# Patient Record
Sex: Female | Born: 1955 | Race: White | Hispanic: No | Smoking: Never smoker
Health system: Southern US, Community
[De-identification: ages and names within clinical notes are randomized; demographics above are authoritative.]

## PROBLEM LIST (undated history)

## (undated) DIAGNOSIS — I10 Essential (primary) hypertension: Secondary | ICD-10-CM

---

## 2014-01-26 ENCOUNTER — Encounter (HOSPITAL_COMMUNITY): Payer: Self-pay | Admitting: Emergency Medicine

## 2014-01-26 ENCOUNTER — Emergency Department (HOSPITAL_COMMUNITY): Payer: No Typology Code available for payment source

## 2014-01-26 ENCOUNTER — Emergency Department (HOSPITAL_COMMUNITY)
Admission: EM | Admit: 2014-01-26 | Discharge: 2014-01-26 | Disposition: A | Discharge status: Home / Self Care | Payer: No Typology Code available for payment source | Attending: Emergency Medicine | Admitting: Emergency Medicine

## 2014-01-26 DIAGNOSIS — S0081XA Abrasion of other part of head, initial encounter: Secondary | ICD-10-CM

## 2014-01-26 DIAGNOSIS — Z79899 Other long term (current) drug therapy: Secondary | ICD-10-CM | POA: Insufficient documentation

## 2014-01-26 DIAGNOSIS — S51012A Laceration without foreign body of left elbow, initial encounter: Secondary | ICD-10-CM

## 2014-01-26 DIAGNOSIS — IMO0002 Reserved for concepts with insufficient information to code with codable children: Secondary | ICD-10-CM | POA: Insufficient documentation

## 2014-01-26 DIAGNOSIS — Y9389 Activity, other specified: Secondary | ICD-10-CM | POA: Insufficient documentation

## 2014-01-26 DIAGNOSIS — S51009A Unspecified open wound of unspecified elbow, initial encounter: Secondary | ICD-10-CM | POA: Insufficient documentation

## 2014-01-26 DIAGNOSIS — S0990XA Unspecified injury of head, initial encounter: Secondary | ICD-10-CM | POA: Insufficient documentation

## 2014-01-26 DIAGNOSIS — S20219A Contusion of unspecified front wall of thorax, initial encounter: Secondary | ICD-10-CM

## 2014-01-26 DIAGNOSIS — Z23 Encounter for immunization: Secondary | ICD-10-CM | POA: Insufficient documentation

## 2014-01-26 DIAGNOSIS — S0993XA Unspecified injury of face, initial encounter: Secondary | ICD-10-CM | POA: Insufficient documentation

## 2014-01-26 DIAGNOSIS — Y92410 Unspecified street and highway as the place of occurrence of the external cause: Secondary | ICD-10-CM | POA: Insufficient documentation

## 2014-01-26 DIAGNOSIS — S199XXA Unspecified injury of neck, initial encounter: Secondary | ICD-10-CM

## 2014-01-26 DIAGNOSIS — I10 Essential (primary) hypertension: Secondary | ICD-10-CM | POA: Insufficient documentation

## 2014-01-26 HISTORY — DX: Essential (primary) hypertension: I10

## 2014-01-26 LAB — CBC
HCT: 45.3 % (ref 36.0–46.0)
Hemoglobin: 15 g/dL (ref 12.0–15.0)
MCH: 29.9 pg (ref 26.0–34.0)
MCHC: 33.1 g/dL (ref 30.0–36.0)
MCV: 90.4 fL (ref 78.0–100.0)
Platelets: 246 10*3/uL (ref 150–400)
RBC: 5.01 MIL/uL (ref 3.87–5.11)
RDW: 13.8 % (ref 11.5–15.5)
WBC: 19 10*3/uL — ABNORMAL HIGH (ref 4.0–10.5)

## 2014-01-26 LAB — I-STAT TROPONIN, ED: Troponin i, poc: 0 ng/mL (ref 0.00–0.08)

## 2014-01-26 LAB — BASIC METABOLIC PANEL
BUN: 19 mg/dL (ref 6–23)
CALCIUM: 9.6 mg/dL (ref 8.4–10.5)
CHLORIDE: 98 meq/L (ref 96–112)
CO2: 27 meq/L (ref 19–32)
CREATININE: 0.94 mg/dL (ref 0.50–1.10)
GFR calc Af Amer: 77 mL/min — ABNORMAL LOW (ref >90)
GFR calc non Af Amer: 66 mL/min — ABNORMAL LOW (ref >90)
Glucose, Bld: 141 mg/dL — ABNORMAL HIGH (ref 70–99)
Potassium: 3.6 mEq/L — ABNORMAL LOW (ref 3.7–5.3)
Sodium: 140 mEq/L (ref 137–147)

## 2014-01-26 MED ORDER — NITROGLYCERIN 0.4 MG SL SUBL
0.4000 mg | SUBLINGUAL_TABLET | SUBLINGUAL | Status: DC | PRN
  Filled 2014-01-26: qty 1

## 2014-01-26 MED ORDER — TETANUS-DIPHTH-ACELL PERTUSSIS 5-2.5-18.5 LF-MCG/0.5 IM SUSP
0.5000 mL | Freq: Once | INTRAMUSCULAR | Status: AC
  Administered 2014-01-26: 0.5 mL via INTRAMUSCULAR
  Filled 2014-01-26: qty 0.5

## 2014-01-26 MED ORDER — OXYCODONE-ACETAMINOPHEN 5-325 MG PO TABS: 1.0000 | ORAL_TABLET | ORAL | Status: AC | PRN

## 2014-01-26 MED ORDER — IOHEXOL 350 MG/ML SOLN
100.0000 mL | Freq: Once | INTRAVENOUS | Status: AC | PRN
  Administered 2014-01-26: 100 mL via INTRAVENOUS

## 2014-01-26 MED ORDER — ASPIRIN 325 MG PO TABS
325.0000 mg | ORAL_TABLET | ORAL | Status: DC
  Filled 2014-01-26: qty 1

## 2014-01-26 MED ORDER — HYDROMORPHONE HCL PF 1 MG/ML IJ SOLN
1.0000 mg | Freq: Once | INTRAMUSCULAR | Status: AC
Start: 2014-01-26 — End: 2014-01-26
  Administered 2014-01-26: 1 mg via INTRAVENOUS
  Filled 2014-01-26: qty 1

## 2014-01-26 NOTE — Discharge Instructions (Signed)
Abrasion °An abrasion is a cut or scrape of the skin. Abrasions do not extend through all layers of the skin and most heal within 10 days. It is important to care for your abrasion properly to prevent infection. °CAUSES  °Most abrasions are caused by falling on, or gliding across, the ground or other surface. When your skin rubs on something, the outer and inner layer of skin rubs off, causing an abrasion. °DIAGNOSIS  °Your caregiver will be able to diagnose an abrasion during a physical exam.  °TREATMENT  °Your treatment depends on how large and deep the abrasion is. Generally, your abrasion will be cleaned with water and a mild soap to remove any dirt or debris. An antibiotic ointment may be put over the abrasion to prevent an infection. A bandage (dressing) may be wrapped around the abrasion to keep it from getting dirty.  °You may need a tetanus shot if: °· You cannot remember when you had your last tetanus shot. °· You have never had a tetanus shot. °· The injury broke your skin. °If you get a tetanus shot, your arm may swell, get red, and feel warm to the touch. This is common and not a problem. If you need a tetanus shot and you choose not to have one, there is a rare chance of getting tetanus. Sickness from tetanus can be serious.  °HOME CARE INSTRUCTIONS  °· If a dressing was applied, change it at least once a day or as directed by your caregiver. If the bandage sticks, soak it off with warm water.   °· Wash the area with water and a mild soap to remove all the ointment 2 times a day. Rinse off the soap and pat the area dry with a clean towel.   °· Reapply any ointment as directed by your caregiver. This will help prevent infection and keep the bandage from sticking. Use gauze over the wound and under the dressing to help keep the bandage from sticking.   °· Change your dressing right away if it becomes wet or dirty.   °· Only take over-the-counter or prescription medicines for pain, discomfort, or fever as  directed by your caregiver.   °· Follow up with your caregiver within 24 48 hours for a wound check, or as directed. If you were not given a wound-check appointment, look closely at your abrasion for redness, swelling, or pus. These are signs of infection. °SEEK IMMEDIATE MEDICAL CARE IF:  °· You have increasing pain in the wound.   °· You have redness, swelling, or tenderness around the wound.   °· You have pus coming from the wound.   °· You have a fever or persistent symptoms for more than 2 3 days. °· You have a fever and your symptoms suddenly get worse. °· You have a bad smell coming from the wound or dressing.   °MAKE SURE YOU:  °· Understand these instructions. °· Will watch your condition. °· Will get help right away if you are not doing well or get worse. °Document Released: 05/09/2005 Document Revised: 07/16/2012 Document Reviewed: 07/03/2011 °[employer]® Patient Information ©2014 [employer]. ° °
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
## 2014-01-26 NOTE — ED Provider Notes (Signed)
LACERATION REPAIR Performed by: Trixie DredgeWEST, EMILY  Performed by Suzzette Righterourtney Scott, PA-S, under my supervision.  Authorized by: Trixie DredgeWEST, EMILY Consent: Verbal consent obtained. Risks and benefits: risks, benefits and alternatives were discussed Consent given by: patient Patient identity confirmed: provided demographic data Prepped and Draped in normal sterile fashion Wound explored  Laceration Location: left elbow  Laceration Length: 4cm  No Foreign Bodies seen or palpated  Anesthesia: local infiltration  Local anesthetic: lidocaine 2% no epinephrine  Anesthetic total: 1.5 ml  Irrigation method: syringe Amount of cleaning: standard  Skin closure: 4-0 ethilon  Number of sutures: 7  Technique: simple interrupted  Patient tolerance: Patient tolerated the procedure well with no immediate complications.   ElysburgEmily West, PA-C 01/26/14 [phone]
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
## 2014-01-27 ENCOUNTER — Encounter (HOSPITAL_COMMUNITY): Payer: Self-pay | Admitting: Emergency Medicine

## 2014-01-27 ENCOUNTER — Emergency Department (HOSPITAL_COMMUNITY)
Admission: EM | Admit: 2014-01-27 | Discharge: 2014-01-27 | Disposition: A | Discharge status: Home / Self Care | Payer: No Typology Code available for payment source | Attending: Emergency Medicine | Admitting: Emergency Medicine

## 2014-01-27 DIAGNOSIS — I10 Essential (primary) hypertension: Secondary | ICD-10-CM | POA: Insufficient documentation

## 2014-01-27 DIAGNOSIS — G8911 Acute pain due to trauma: Secondary | ICD-10-CM | POA: Insufficient documentation

## 2014-01-27 DIAGNOSIS — Z760 Encounter for issue of repeat prescription: Secondary | ICD-10-CM

## 2014-01-27 DIAGNOSIS — R000 Tachycardia, unspecified: Secondary | ICD-10-CM | POA: Insufficient documentation

## 2014-01-27 DIAGNOSIS — Z79899 Other long term (current) drug therapy: Secondary | ICD-10-CM | POA: Insufficient documentation

## 2014-01-27 MED ORDER — OXYCODONE-ACETAMINOPHEN 5-325 MG PO TABS
1.0000 | ORAL_TABLET | Freq: Once | ORAL | Status: AC
  Administered 2014-01-27: 1 via ORAL
  Filled 2014-01-27: qty 1

## 2014-01-27 NOTE — Discharge Instructions (Signed)
Medication Refill, Emergency Department We have refilled your medication today as a courtesy to you. It is best for your medical care, however, to take care of getting refills done through your primary caregiver's office. They have your records and can do a better job of follow-up than we can in the emergency department. On maintenance medications, we often only prescribe enough medications to get you by until you are able to see your regular caregiver. This is a more expensive way to refill medications. In the future, please plan for refills so that you will not have to use the emergency department for this. Thank you for your help. Your help allows us to better take care of the daily emergencies that enter our department. Document Released: 11/16/2003 Document Revised: 10/22/2011 Document Reviewed: 07/30/2005 [facility] Patient Information 2015 [employer], [place]. This information is not intended to replace advice given to you by your health care provider. Make sure you discuss any questions you have with your health care provider. Please talk to the social worker on the floor where your family members are being cared for to help you fill your prescriptions
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
## 2014-01-27 NOTE — ED Provider Notes (Signed)
Medical screening examination/treatment/procedure(s) were performed by non-physician practitioner and as supervising physician I was immediately available for consultation/collaboration.   EKG Interpretation None       April K Palumbo-Rasch, MD 01/27/14 [phone]
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
## 2014-01-29 NOTE — ED Provider Notes (Signed)
Medical screening examination/treatment/procedure(s) were performed by non-physician practitioner and as supervising physician I was immediately available for consultation/collaboration.   EKG Interpretation   Date/Time:  Tuesday January 26 2014 11:29:06 EDT Ventricular Rate:  105 PR Interval:  111 QRS Duration: 68 QT Interval:  321 QTC Calculation: 424 R Axis:   76 Text Interpretation:  Sinus tachycardia no evidence of acute ischemia No  old tracing to compare Confirmed by GOLDSTON  MD, SCOTT ([id number]) on  01/26/2014 11:58:42 AM        Audree CamelScott T Goldston, MD 01/29/14 [phone]

## 2014-02-03 ENCOUNTER — Telehealth (HOSPITAL_BASED_OUTPATIENT_CLINIC_OR_DEPARTMENT_OTHER): Payer: Self-pay

## 2014-02-03 NOTE — Telephone Encounter (Signed)
Pt [facility] for [facility]  Chart reviewed and EPIC showing Rx was printed. Pt stating she has Rx informed her to take to pharmacy of her choice.
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
